# Patient Record
Sex: Male | Born: 1955
Health system: Southern US, Community
[De-identification: ages and names within clinical notes are randomized; demographics above are authoritative.]

## PROBLEM LIST (undated history)

## (undated) HISTORY — PX: BACK SURGERY: SHX140

## (undated) HISTORY — PX: KNEE ARTHROSCOPY: SUR90

---

## 2001-01-19 ENCOUNTER — Encounter: Payer: Self-pay | Admitting: Neurosurgery

## 2001-01-19 ENCOUNTER — Ambulatory Visit (HOSPITAL_COMMUNITY): Admission: RE | Admit: 2001-01-19 | Discharge: 2001-01-19 | Payer: Self-pay | Admitting: Neurosurgery

## 2001-01-23 ENCOUNTER — Encounter: Payer: Self-pay | Admitting: Neurosurgery

## 2001-01-23 ENCOUNTER — Encounter: Admission: RE | Admit: 2001-01-23 | Discharge: 2001-01-23 | Payer: Self-pay | Admitting: Neurosurgery

## 2001-02-06 ENCOUNTER — Encounter: Admission: RE | Admit: 2001-02-06 | Discharge: 2001-02-06 | Payer: Self-pay | Admitting: Neurosurgery

## 2001-02-06 ENCOUNTER — Encounter: Payer: Self-pay | Admitting: Neurosurgery

## 2001-02-08 ENCOUNTER — Inpatient Hospital Stay (HOSPITAL_COMMUNITY): Admission: RE | Admit: 2001-02-08 | Discharge: 2001-02-09 | Payer: Self-pay | Admitting: Neurosurgery

## 2001-02-08 ENCOUNTER — Encounter: Payer: Self-pay | Admitting: Neurosurgery

## 2001-03-27 ENCOUNTER — Ambulatory Visit (HOSPITAL_COMMUNITY): Admission: RE | Admit: 2001-03-27 | Discharge: 2001-03-27 | Payer: Self-pay | Admitting: Neurosurgery

## 2001-03-27 ENCOUNTER — Encounter: Payer: Self-pay | Admitting: Neurosurgery

## 2001-04-02 ENCOUNTER — Ambulatory Visit (HOSPITAL_COMMUNITY): Admission: RE | Admit: 2001-04-02 | Discharge: 2001-04-02 | Payer: Self-pay | Admitting: Neurosurgery

## 2001-04-02 ENCOUNTER — Encounter: Payer: Self-pay | Admitting: Neurosurgery

## 2003-03-10 ENCOUNTER — Ambulatory Visit (HOSPITAL_COMMUNITY): Admission: RE | Admit: 2003-03-10 | Discharge: 2003-03-10 | Payer: Self-pay | Admitting: Internal Medicine

## 2006-09-05 ENCOUNTER — Ambulatory Visit (HOSPITAL_COMMUNITY): Admission: RE | Admit: 2006-09-05 | Discharge: 2006-09-05 | Payer: Self-pay | Admitting: *Deleted

## 2011-12-22 ENCOUNTER — Observation Stay (HOSPITAL_COMMUNITY)
Admission: EM | Admit: 2011-12-22 | Discharge: 2011-12-23 | Disposition: A | Discharge status: Home / Self Care | Payer: 59 | Attending: Emergency Medicine | Admitting: Emergency Medicine

## 2011-12-22 ENCOUNTER — Encounter (HOSPITAL_COMMUNITY)
Admission: EM | Disposition: A | Discharge status: Home / Self Care | Payer: Self-pay | Source: Home / Self Care | Attending: Emergency Medicine

## 2011-12-22 ENCOUNTER — Other Ambulatory Visit: Payer: Self-pay | Admitting: Internal Medicine

## 2011-12-22 ENCOUNTER — Encounter (HOSPITAL_COMMUNITY): Payer: Self-pay | Admitting: *Deleted

## 2011-12-22 ENCOUNTER — Emergency Department (HOSPITAL_COMMUNITY): Payer: 59 | Admitting: *Deleted

## 2011-12-22 ENCOUNTER — Encounter (HOSPITAL_COMMUNITY): Payer: Self-pay | Admitting: Emergency Medicine

## 2011-12-22 ENCOUNTER — Ambulatory Visit
Admission: RE | Admit: 2011-12-22 | Discharge: 2011-12-22 | Disposition: A | Discharge status: Home / Self Care | Payer: 59 | Source: Ambulatory Visit | Attending: Internal Medicine | Admitting: Internal Medicine

## 2011-12-22 DIAGNOSIS — M129 Arthropathy, unspecified: Secondary | ICD-10-CM | POA: Insufficient documentation

## 2011-12-22 DIAGNOSIS — R1031 Right lower quadrant pain: Secondary | ICD-10-CM

## 2011-12-22 DIAGNOSIS — K3580 Unspecified acute appendicitis: Secondary | ICD-10-CM

## 2011-12-22 HISTORY — PX: LAPAROSCOPIC APPENDECTOMY: SHX408

## 2011-12-22 LAB — CBC WITH DIFFERENTIAL/PLATELET
Basophils Absolute: 0 10*3/uL (ref 0.0–0.1)
Eosinophils Relative: 1 % (ref 0–5)
HCT: 47.2 % (ref 39.0–52.0)
Hemoglobin: 16 g/dL (ref 13.0–17.0)
Lymphocytes Relative: 37 % (ref 12–46)
MCHC: 33.9 g/dL (ref 30.0–36.0)
MCV: 93.3 fL (ref 78.0–100.0)
Monocytes Absolute: 0.6 10*3/uL (ref 0.1–1.0)
Monocytes Relative: 5 % (ref 3–12)
Neutro Abs: 5.7 10*3/uL (ref 1.7–7.7)
RDW: 13.3 % (ref 11.5–15.5)
WBC: 10.1 10*3/uL (ref 4.0–10.5)

## 2011-12-22 LAB — COMPREHENSIVE METABOLIC PANEL
AST: 17 U/L (ref 0–37)
BUN: 14 mg/dL (ref 6–23)
CO2: 26 mEq/L (ref 19–32)
Calcium: 9.7 mg/dL (ref 8.4–10.5)
Chloride: 102 mEq/L (ref 96–112)
Creatinine, Ser: 1.18 mg/dL (ref 0.50–1.35)
GFR calc Af Amer: 79 mL/min — ABNORMAL LOW (ref >90)
GFR calc non Af Amer: 68 mL/min — ABNORMAL LOW (ref >90)
Total Bilirubin: 0.6 mg/dL (ref 0.3–1.2)

## 2011-12-22 SURGERY — APPENDECTOMY, LAPAROSCOPIC
Anesthesia: General | Site: Abdomen | Wound class: Clean Contaminated

## 2011-12-22 MED ORDER — SODIUM CHLORIDE 0.9 % IR SOLN
Status: DC | PRN
  Administered 2011-12-22: 1000 mL

## 2011-12-22 MED ORDER — ONDANSETRON HCL 4 MG PO TABS: 4.0000 mg | ORAL_TABLET | Freq: Four times a day (QID) | ORAL | Status: DC | PRN

## 2011-12-22 MED ORDER — ONDANSETRON HCL 4 MG/2ML IJ SOLN: 4.0000 mg | Freq: Four times a day (QID) | INTRAMUSCULAR | Status: DC | PRN

## 2011-12-22 MED ORDER — HYDROMORPHONE HCL PF 1 MG/ML IJ SOLN: 0.2500 mg | INTRAMUSCULAR | Status: DC | PRN

## 2011-12-22 MED ORDER — NEOSTIGMINE METHYLSULFATE 1 MG/ML IJ SOLN
INTRAMUSCULAR | Status: DC | PRN
  Administered 2011-12-22: 5 mg via INTRAVENOUS

## 2011-12-22 MED ORDER — ENOXAPARIN SODIUM 40 MG/0.4ML ~~LOC~~ SOLN
40.0000 mg | SUBCUTANEOUS | Status: DC
  Filled 2011-12-22: qty 0.4

## 2011-12-22 MED ORDER — DEXTROSE 5 % IV SOLN
2.0000 g | INTRAVENOUS | Status: AC
  Administered 2011-12-22: 2 g via INTRAVENOUS
  Filled 2011-12-22 (×2): qty 2

## 2011-12-22 MED ORDER — POTASSIUM CHLORIDE IN NACL 20-0.9 MEQ/L-% IV SOLN
INTRAVENOUS | Status: DC
  Administered 2011-12-22: 22:00:00 via INTRAVENOUS
  Filled 2011-12-22 (×2): qty 1000

## 2011-12-22 MED ORDER — PROPOFOL 10 MG/ML IV BOLUS
INTRAVENOUS | Status: DC | PRN
  Administered 2011-12-22: 170 mg via INTRAVENOUS

## 2011-12-22 MED ORDER — BUPIVACAINE-EPINEPHRINE (PF) 0.5% -1:200000 IJ SOLN
INTRAMUSCULAR | Status: AC
  Filled 2011-12-22: qty 10

## 2011-12-22 MED ORDER — DROPERIDOL 2.5 MG/ML IJ SOLN
INTRAMUSCULAR | Status: DC | PRN
  Administered 2011-12-22: 0.625 mg via INTRAVENOUS

## 2011-12-22 MED ORDER — LACTATED RINGERS IV SOLN
INTRAVENOUS | Status: DC | PRN
  Administered 2011-12-22: 18:00:00 via INTRAVENOUS

## 2011-12-22 MED ORDER — SUCCINYLCHOLINE CHLORIDE 20 MG/ML IJ SOLN
INTRAMUSCULAR | Status: DC | PRN
  Administered 2011-12-22: 120 mg via INTRAVENOUS

## 2011-12-22 MED ORDER — MORPHINE SULFATE 4 MG/ML IJ SOLN: 4.0000 mg | INTRAMUSCULAR | Status: DC | PRN

## 2011-12-22 MED ORDER — IOHEXOL 300 MG/ML  SOLN
125.0000 mL | Freq: Once | INTRAMUSCULAR | Status: AC | PRN
  Administered 2011-12-22: 125 mL via INTRAVENOUS

## 2011-12-22 MED ORDER — MIDAZOLAM HCL 5 MG/5ML IJ SOLN
INTRAMUSCULAR | Status: DC | PRN
  Administered 2011-12-22: 2 mg via INTRAVENOUS

## 2011-12-22 MED ORDER — FENTANYL CITRATE 0.05 MG/ML IJ SOLN
INTRAMUSCULAR | Status: DC | PRN
Start: 2011-12-22 — End: 2011-12-22
  Administered 2011-12-22: 100 ug via INTRAVENOUS
  Administered 2011-12-22: 50 ug via INTRAVENOUS

## 2011-12-22 MED ORDER — KETOROLAC TROMETHAMINE 30 MG/ML IJ SOLN
INTRAMUSCULAR | Status: DC | PRN
  Administered 2011-12-22: 30 mg via INTRAVENOUS

## 2011-12-22 MED ORDER — GLYCOPYRROLATE 0.2 MG/ML IJ SOLN
INTRAMUSCULAR | Status: DC | PRN
  Administered 2011-12-22: .6 mg via INTRAVENOUS

## 2011-12-22 MED ORDER — ONDANSETRON HCL 4 MG/2ML IJ SOLN
INTRAMUSCULAR | Status: DC | PRN
  Administered 2011-12-22: 4 mg via INTRAVENOUS

## 2011-12-22 MED ORDER — LIDOCAINE HCL (CARDIAC) 20 MG/ML IV SOLN
INTRAVENOUS | Status: DC | PRN
  Administered 2011-12-22: 80 mg via INTRAVENOUS

## 2011-12-22 MED ORDER — HYDROCODONE-ACETAMINOPHEN 5-325 MG PO TABS
1.0000 | ORAL_TABLET | ORAL | Status: DC | PRN
  Administered 2011-12-22: 1 via ORAL
  Filled 2011-12-22: qty 1

## 2011-12-22 MED ORDER — BUPIVACAINE-EPINEPHRINE 0.5% -1:200000 IJ SOLN
INTRAMUSCULAR | Status: DC | PRN
  Administered 2011-12-22: 20 mL

## 2011-12-22 MED ORDER — ROCURONIUM BROMIDE 100 MG/10ML IV SOLN
INTRAVENOUS | Status: DC | PRN
  Administered 2011-12-22: 30 mg via INTRAVENOUS

## 2011-12-22 MED ORDER — IBUPROFEN 600 MG PO TABS
600.0000 mg | ORAL_TABLET | Freq: Four times a day (QID) | ORAL | Status: DC | PRN
  Filled 2011-12-22: qty 1

## 2011-12-22 SURGICAL SUPPLY — 43 items
APL SKNCLS STERI-STRIP NONHPOA (GAUZE/BANDAGES/DRESSINGS) ×1
APPLIER CLIP LOGIC TI 5 (MISCELLANEOUS) IMPLANT
APPLIER CLIP ROT 10 11.4 M/L (STAPLE)
APR CLP MED LRG 11.4X10 (STAPLE)
APR CLP MED LRG 33X5 (MISCELLANEOUS)
BAG SPEC RTRVL LRG 6X4 10 (ENDOMECHANICALS) ×1
BANDAGE ADHESIVE 1X3 (GAUZE/BANDAGES/DRESSINGS) ×6 IMPLANT
BENZOIN TINCTURE PRP APPL 2/3 (GAUZE/BANDAGES/DRESSINGS) ×2 IMPLANT
BNDG ADH 5X3 H2O RPLNT NS (GAUZE/BANDAGES/DRESSINGS) ×1
BNDG COHESIVE 3X5 WHT NS (GAUZE/BANDAGES/DRESSINGS) ×1 IMPLANT
CANISTER SUCTION 2500CC (MISCELLANEOUS) ×2 IMPLANT
CHLORAPREP W/TINT 26ML (MISCELLANEOUS) ×2 IMPLANT
CLIP APPLIE ROT 10 11.4 M/L (STAPLE) IMPLANT
CLOTH BEACON ORANGE TIMEOUT ST (SAFETY) ×2 IMPLANT
COVER SURGICAL LIGHT HANDLE (MISCELLANEOUS) ×2 IMPLANT
CUTTER LINEAR ENDO 35 ETS (STAPLE) ×1 IMPLANT
CUTTER LINEAR ENDO 35 ETS TH (STAPLE) IMPLANT
DECANTER SPIKE VIAL GLASS SM (MISCELLANEOUS) ×2 IMPLANT
ELECT REM PT RETURN 9FT ADLT (ELECTROSURGICAL) ×2
ELECTRODE REM PT RTRN 9FT ADLT (ELECTROSURGICAL) ×1 IMPLANT
ENDOLOOP SUT PDS II  0 18 (SUTURE)
ENDOLOOP SUT PDS II 0 18 (SUTURE) IMPLANT
GLOVE SURG SIGNA 7.5 PF LTX (GLOVE) ×2 IMPLANT
GOWN PREVENTION PLUS XLARGE (GOWN DISPOSABLE) ×2 IMPLANT
GOWN STRL NON-REIN LRG LVL3 (GOWN DISPOSABLE) ×2 IMPLANT
KIT BASIN OR (CUSTOM PROCEDURE TRAY) ×2 IMPLANT
KIT ROOM TURNOVER OR (KITS) ×2 IMPLANT
NS IRRIG 1000ML POUR BTL (IV SOLUTION) ×2 IMPLANT
PAD ARMBOARD 7.5X6 YLW CONV (MISCELLANEOUS) ×4 IMPLANT
POUCH SPECIMEN RETRIEVAL 10MM (ENDOMECHANICALS) ×2 IMPLANT
RELOAD /EVU35 (ENDOMECHANICALS) IMPLANT
RELOAD CUTTER ETS 35MM STAND (ENDOMECHANICALS) IMPLANT
SCALPEL HARMONIC ACE (MISCELLANEOUS) ×2 IMPLANT
SET IRRIG TUBING LAPAROSCOPIC (IRRIGATION / IRRIGATOR) ×2 IMPLANT
SLEEVE ENDOPATH XCEL 5M (ENDOMECHANICALS) ×2 IMPLANT
SPECIMEN JAR SMALL (MISCELLANEOUS) ×2 IMPLANT
SUT MON AB 4-0 PC3 18 (SUTURE) ×2 IMPLANT
TOWEL OR 17X24 6PK STRL BLUE (TOWEL DISPOSABLE) ×2 IMPLANT
TOWEL OR 17X26 10 PK STRL BLUE (TOWEL DISPOSABLE) ×2 IMPLANT
TRAY LAPAROSCOPIC (CUSTOM PROCEDURE TRAY) ×2 IMPLANT
TROCAR XCEL BLUNT TIP 100MML (ENDOMECHANICALS) ×2 IMPLANT
TROCAR XCEL NON-BLD 5MMX100MML (ENDOMECHANICALS) ×2 IMPLANT
WATER STERILE IRR 1000ML POUR (IV SOLUTION) IMPLANT

## 2011-12-22 NOTE — Op Note (Signed)
[name], [name] NO.:  [id number]  MEDICAL RECORD NO.:  [id number]  LOCATION:  6N12C                        FACILITY:  [facility]  PHYSICIAN:  Abigail Miyamoto, M.D. DATE OF BIRTH:  Oct 11, 1955  DATE OF PROCEDURE:  12/22/2011 DATE OF DISCHARGE:                              OPERATIVE REPORT   PREOPERATIVE DIAGNOSIS:  Acute appendicitis.  POSTOPERATIVE DIAGNOSIS:  Acute appendicitis.  PROCEDURE:  Laparoscopic appendectomy.  SURGEON:  Abigail Miyamoto, MD  ANESTHESIA:  General and 0.5% Marcaine.  ESTIMATED BLOOD LOSS:  Minimal.  FINDINGS:  The patient was found have early acute appendicitis without evidence of perforation.  PROCEDURE IN DETAIL:  The patient was brought to the operating room, identified as Jimmy Munoz.  He was placed supine on the operating room table, and general anesthesia induced.  His abdomen was then prepped and draped in the usual sterile fashion.  Using a #15 blade, a small vertical incision was made below the umbilicus.  This was carried down to the fascia, which was opened with a scalpel.  A hemostat was then used to pass into the peritoneal cavity under direct vision.  Next, a 0 Vicryl pursestring suture was placed around the fascial opening.  The Hasson port was placed through the opening and insufflation of the abdomen was begun.  A 5-mm port was then placed in the patient's right upper quadrant and another in the lower midline under direct vision. The appendix was then identified and visualized.  It was found to be consistent with acute appendicitis without evidence of perforation.  I was able to easily dissect out the base of the appendix and transect it with the endoscopic GIA stapler.  I then took down the mesoappendix with the Harmonic Scalpel.  Once the appendix was free, it was placed in an endosac and removed through the incision at the umbilicus.  I then thoroughly irrigated the abdomen with normal saline.   Hemostasis appeared to be achieved at the appendiceal stump and mesoappendix.  All ports were removed under direct vision.  The abdomen was deflated.  The 0 Vicryl at the umbilicus was tied in place closing the fascial defect. All incisions were anesthetized with Marcaine and closed with 4-0 Monocryl subcuticular sutures.  Steri- Strips and Band-Aids were then applied.  The patient tolerated the procedure well.  All the counts were correct at the end of the procedure.  The patient was then extubated in the operating room and taken in a stable condition to the recovery room.     Abigail Miyamoto, M.D.     DB/MEDQ  D:  12/22/2011  T:  12/22/2011  Job:  [id number]

## 2011-12-22 NOTE — H&P (Signed)
Jimmy Munoz is an 56 y.o. male.   Chief Complaint: RLQ abdominal pain HPI: this gentleman presents with a two-day history of right lower quadrant abdominal pain.  He saw his primary care physician yesterday and was started on antibiotics. A CAT scan of the abdomen today demonstrates appendicitis. He reports the pain is only mild to moderate nature. It refers across his lower abdomen. He has no nausea or vomiting. He denies fevers. He is otherwise without complaints. History reviewed. No pertinent past medical history.  Past Surgical History  Procedure Date  . Back surgery   . Knee arthroscopy     No family history on file. Social History:  reports that he has never smoked. He does not have any smokeless tobacco history on file. He reports that he drinks alcohol. He reports that he does not use illicit drugs.  Allergies: No Known Allergies   (Not in a hospital admission)  Results for orders placed during the hospital encounter of 12/22/11 (from the past 48 hour(s))  CBC WITH DIFFERENTIAL     Status: Normal   Collection Time   12/22/11  4:20 PM      Component Value Range Comment   WBC 10.1  4.0 - 10.5 K/uL    RBC 5.06  4.22 - 5.81 MIL/uL    Hemoglobin 16.0  13.0 - 17.0 g/dL    HCT 16.1  09.6 - 04.5 %    MCV 93.3  78.0 - 100.0 fL    MCH 31.6  26.0 - 34.0 pg    MCHC 33.9  30.0 - 36.0 g/dL    RDW 40.9  81.1 - 91.4 %    Platelets 222  150 - 400 K/uL    Neutrophils Relative 57  43 - 77 %    Neutro Abs 5.7  1.7 - 7.7 K/uL    Lymphocytes Relative 37  12 - 46 %    Lymphs Abs 3.8  0.7 - 4.0 K/uL    Monocytes Relative 5  3 - 12 %    Monocytes Absolute 0.6  0.1 - 1.0 K/uL    Eosinophils Relative 1  0 - 5 %    Eosinophils Absolute 0.1  0.0 - 0.7 K/uL    Basophils Relative 0  0 - 1 %    Basophils Absolute 0.0  0.0 - 0.1 K/uL   COMPREHENSIVE METABOLIC PANEL     Status: Abnormal   Collection Time   12/22/11  4:20 PM      Component Value Range Comment   Sodium 138  135 - 145 mEq/L    Potassium 3.7  3.5 - 5.1 mEq/L    Chloride 102  96 - 112 mEq/L    CO2 26  19 - 32 mEq/L    Glucose, Bld 94  70 - 99 mg/dL    BUN 14  6 - 23 mg/dL    Creatinine, Ser 7.82  0.50 - 1.35 mg/dL    Calcium 9.7  8.4 - 95.6 mg/dL    Total Protein 7.3  6.0 - 8.3 g/dL    Albumin 4.1  3.5 - 5.2 g/dL    AST 17  0 - 37 U/L    ALT 16  0 - 53 U/L    Alkaline Phosphatase 69  39 - 117 U/L    Total Bilirubin 0.6  0.3 - 1.2 mg/dL    GFR calc non Af Amer 68 (*) >90 mL/min    GFR calc Af Amer 79 (*) >90 mL/min  Ct Abdomen Pelvis W Contrast  12/22/2011  *RADIOLOGY REPORT*  Clinical Data: Right lower quadrant pain.  CT ABDOMEN AND PELVIS WITH CONTRAST  Technique:  Multidetector CT imaging of the abdomen and pelvis was performed following the standard protocol during bolus administration of intravenous contrast.  Contrast: OMNIPAQUE IOHEXOL 300 MG/ML  SOLN  Comparison: None.  Findings: There is acute appendicitis with periappendiceal inflammation but without abscess or perforation.  The liver, spleen, pancreas, biliary tree, adrenal glands, and kidneys are normal except for a 3.2 cm simple appearing cyst on the upper pole of the left kidney.  The bowel is normal except for appendicitis.  Terminal ileum is normal.  No adenopathy.  No significant osseous abnormality.  IMPRESSION: Acute appendicitis.  No perforation or abscess.  Original Report Authenticated By: Gwynn Burly, M.D.    Review of Systems  Constitutional: Negative for fever and chills.  HENT: Negative.   Eyes: Negative.   Respiratory: Negative.   Cardiovascular: Negative.   Gastrointestinal: Positive for abdominal pain. Negative for nausea and vomiting.  Genitourinary: Negative.   Skin: Negative.   Neurological: Negative.   Endo/Heme/Allergies: Negative.   Psychiatric/Behavioral: Negative.     Blood pressure 115/74, pulse 81, temperature 97.5 F (36.4 C), temperature source Oral, SpO2 96.00%. Physical Exam  Constitutional: He is  oriented to person, place, and time. He appears well-developed and well-nourished. No distress.  HENT:  Head: Normocephalic and atraumatic.  Right Ear: External ear normal.  Left Ear: External ear normal.  Nose: Nose normal.  Mouth/Throat: Oropharynx is clear and moist. No oropharyngeal exudate.  Eyes: Conjunctivae are normal. Pupils are equal, round, and reactive to light. Right eye exhibits no discharge. Left eye exhibits no discharge. No scleral icterus.  Neck: Normal range of motion. Neck supple. No tracheal deviation present. No thyromegaly present.  Cardiovascular: Normal rate, regular rhythm, normal heart sounds and intact distal pulses.   No murmur heard. Respiratory: Effort normal and breath sounds normal. No respiratory distress. He has no wheezes.  GI: Soft. Bowel sounds are normal. There is tenderness. There is guarding.       There is tenderness with mild guarding in the right lower quadrant. The rest of the abdomen is soft and nontender  Musculoskeletal: Normal range of motion. He exhibits no edema and no tenderness.  Lymphadenopathy:    He has no cervical adenopathy.  Neurological: He is alert and oriented to person, place, and time.  Skin: Skin is warm and dry. No rash noted. He is not diaphoretic. No erythema.  Psychiatric: His behavior is normal. Judgment normal.     Assessment/Plan Acute appendicitis  Lap appendectomy is recommended. I discussed this with the patient and his wife in detail. I discussed the risks of surgery which includes but is not limited to bleeding, infection, injury to stranding structures, need to convert to an open procedure, appendiceal stump leak, etc. He understands and wishes to proceed. Surgery will be scheduled. Preop antibiotics will be given. Likelihood of success is good  Sophiana Milanese A 12/22/2011, 5:22 PM

## 2011-12-22 NOTE — ED Notes (Signed)
Dr. Magnus Ivan in with patient to discuss surgery

## 2011-12-22 NOTE — Transfer of Care (Signed)
Immediate Anesthesia Transfer of Care Note  Patient: Jimmy Munoz  Procedure(s) Performed: Procedure(s) (LRB): APPENDECTOMY LAPAROSCOPIC (N/A)  Patient Location: PACU  Anesthesia Type: General  Level of Consciousness: awake, alert  and oriented  Airway & Oxygen Therapy: Patient Spontanous Breathing and Patient connected to face mask oxygen  Post-op Assessment: Report given to PACU RN and Post -op Vital signs reviewed and stable  Post vital signs: Reviewed and stable  Complications: No apparent anesthesia complications

## 2011-12-22 NOTE — Anesthesia Preprocedure Evaluation (Addendum)
Anesthesia Evaluation  Patient identified by MRN, date of birth, ID band Patient awake    Reviewed: Allergy & Precautions, H&P , NPO status , Patient's Chart, lab work & pertinent test results, reviewed documented beta blocker date and time   Airway Mallampati: II  Neck ROM: full    Dental  (+) Teeth Intact and Dental Advisory Given   Pulmonary          Cardiovascular     Neuro/Psych    GI/Hepatic   Endo/Other    Renal/GU      Musculoskeletal  (+) Arthritis -,   Abdominal   Peds  Hematology   Anesthesia Other Findings   Reproductive/Obstetrics                          Anesthesia Physical Anesthesia Plan  ASA: II and Emergent  Anesthesia Plan: General   Post-op Pain Management:    Induction: Intravenous  Airway Management Planned: Oral ETT  Additional Equipment:   Intra-op Plan:   Post-operative Plan: Extubation in OR  Informed Consent: I have reviewed the patients History and Physical, chart, labs and discussed the procedure including the risks, benefits and alternatives for the proposed anesthesia with the patient or authorized representative who has indicated his/her understanding and acceptance.   Dental advisory given  Plan Discussed with: CRNA, Surgeon and Anesthesiologist  Anesthesia Plan Comments:        Anesthesia Quick Evaluation

## 2011-12-22 NOTE — ED Provider Notes (Signed)
History     CSN: [id number]  Arrival date & time 12/22/11  1551   First MD Initiated Contact with Patient 12/22/11 1622      Chief Complaint  Patient presents with  . Abdominal Pain    (Consider location/radiation/quality/duration/timing/severity/associated sxs/prior treatment) Patient is a 56 y.o. male presenting with abdominal pain. The history is provided by the patient and medical records.  Abdominal Pain The primary symptoms of the illness include abdominal pain, nausea and vomiting. The primary symptoms of the illness do not include fever, shortness of breath, diarrhea or dysuria. The current episode started 2 days ago. The onset of the illness was gradual. The problem has not changed since onset. The abdominal pain is located in the suprapubic region and RLQ. The abdominal pain does not radiate. Pain scale: moderate. The abdominal pain is relieved by nothing.  The patient has not had a change in bowel habit. Additional symptoms associated with the illness include chills and anorexia. Symptoms associated with the illness do not include urgency, frequency or back pain.    History reviewed. No pertinent past medical history.  Past Surgical History  Procedure Date  . Back surgery   . Knee arthroscopy     No family history on file.  History  Substance Use Topics  . Smoking status: Never Smoker   . Smokeless tobacco: Not on file  . Alcohol Use: Yes     ocassional      Review of Systems  Unable to perform ROS Constitutional: Positive for chills. Negative for fever.  Respiratory: Negative for cough and shortness of breath.   Cardiovascular: Negative for chest pain and palpitations.  Gastrointestinal: Positive for nausea, vomiting, abdominal pain and anorexia. Negative for diarrhea.  Genitourinary: Negative for dysuria, urgency and frequency.  Musculoskeletal: Negative for back pain.  Skin: Negative for color change and rash.  Neurological: Negative for  light-headedness and headaches.  All other systems reviewed and are negative.    Allergies  Review of patient's allergies indicates no known allergies.  Home Medications   Current Outpatient Rx  Name Route Sig Dispense Refill  . ACETAMINOPHEN 500 MG PO TABS Oral Take 1,000 mg by mouth every 6 (six) hours as needed. For pain    . CIPROFLOXACIN HCL 500 MG PO TABS Oral Take 500 mg by mouth 2 (two) times daily.    Marland Kitchen METRONIDAZOLE 500 MG PO TABS Oral Take 500 mg by mouth 3 (three) times daily.    Marland Kitchen PROMETHAZINE HCL 25 MG PO TABS Oral Take 25 mg by mouth 3 (three) times daily as needed. For nausea      BP 115/74  Pulse 81  Temp 97.5 F (36.4 C) (Oral)  SpO2 96%  Physical Exam  Nursing note and vitals reviewed. Constitutional: He is oriented to person, place, and time. He appears well-developed and well-nourished.  HENT:  Head: Normocephalic and atraumatic.  Eyes: EOM are normal. Pupils are equal, round, and reactive to light.  Cardiovascular: Normal rate and regular rhythm.   Pulmonary/Chest: Effort normal and breath sounds normal. No respiratory distress.  Abdominal: Soft. Bowel sounds are normal. He exhibits no distension. There is tenderness (mild, RLQ). There is no rebound and no guarding.  Neurological: He is alert and oriented to person, place, and time.  Skin: Skin is warm and dry.  Psychiatric: He has a normal mood and affect.    ED Course  Procedures (including critical care time)  Labs Reviewed  COMPREHENSIVE METABOLIC PANEL - Abnormal; Notable for  the following:    GFR calc non Af Amer 68 (*)     GFR calc Af Amer 79 (*)     All other components within normal limits  CBC WITH DIFFERENTIAL   Ct Abdomen Pelvis W Contrast  12/22/2011  *RADIOLOGY REPORT*  Clinical Data: Right lower quadrant pain.  CT ABDOMEN AND PELVIS WITH CONTRAST  Technique:  Multidetector CT imaging of the abdomen and pelvis was performed following the standard protocol during bolus administration  of intravenous contrast.  Contrast: OMNIPAQUE IOHEXOL 300 MG/ML  SOLN  Comparison: None.  Findings: There is acute appendicitis with periappendiceal inflammation but without abscess or perforation.  The liver, spleen, pancreas, biliary tree, adrenal glands, and kidneys are normal except for a 3.2 cm simple appearing cyst on the upper pole of the left kidney.  The bowel is normal except for appendicitis.  Terminal ileum is normal.  No adenopathy.  No significant osseous abnormality.  IMPRESSION: Acute appendicitis.  No perforation or abscess.  Original Report Authenticated By: Gwynn Burly, M.D.     1. Acute appendicitis       MDM  56 year old male presents her to be acute appendicitis. He states that in the evening of the 24th he began having suprapubic pain, and when he woke up on the morning of the 25th had significantly worsened and had migrated towards the right upper quadrant. He states that he had nausea with several episodes of vomiting and was seen by his PCP that afternoon. Blood work was drawn, which showed a reported leukocytosis, so patient was scheduled for a CT scan this afternoon. The patient received a call from his doctor shortly after he got home stating that he had acute appendicitis, and was advised to come to the ED. The patient states that his pain is overall better than it was yesterday, he still has some nausea, but has not vomited since yesterday. He has no prior surgeries, and last ate at about 2:30 this afternoon; patient was advised not to eat anything further. Patient with mild tenderness on exam, and no peritoneal signs. Surgery was consult, and will take him to the OR.        Theotis Burrow, MD 12/23/11 [phone]

## 2011-12-22 NOTE — Op Note (Signed)
APPENDECTOMY LAPAROSCOPIC  Procedure Note  Jimmy Munoz 12/22/2011   Pre-op Diagnosis: acute appendicitis     Post-op Diagnosis: same  Procedure(s): APPENDECTOMY LAPAROSCOPIC  Surgeon(s): Shelly Rubenstein, MD  Anesthesia: General  Staff:  Verdie Drown, RN - Circulator Staci Righter, CST - Scrub Person Harvie Bridge, RN - Circulator Allyson Dennison Nancy, RN - Circulator Donald Pore, CST - Scrub Person  Estimated Blood Loss: Minimal               Specimens: appendix          Jimmy Munoz   Date: 12/22/2011  Time: 7:05 PM

## 2011-12-22 NOTE — Preoperative (Signed)
Beta Blockers   Reason not to administer Beta Blockers:Not Applicable 

## 2011-12-22 NOTE — Anesthesia Postprocedure Evaluation (Signed)
Anesthesia Post Note  Patient: Jimmy Munoz  Procedure(s) Performed: Procedure(s) (LRB): APPENDECTOMY LAPAROSCOPIC (N/A)  Anesthesia type: General  Patient location: PACU  Post pain: Pain level controlled and Adequate analgesia  Post assessment: Post-op Vital signs reviewed, Patient's Cardiovascular Status Stable, Respiratory Function Stable, Patent Airway and Pain level controlled  Last Vitals:  Filed Vitals:   12/22/11 1920  BP: 153/88  Pulse: 75  Temp: 36.9 C  Resp: 18    Post vital signs: Reviewed and stable  Level of consciousness: awake, alert  and oriented  Complications: No apparent anesthesia complications

## 2011-12-22 NOTE — ED Notes (Signed)
Dr. Mahoney at bedside.

## 2011-12-22 NOTE — ED Notes (Signed)
Pt st's he has had lower abd pain since Wed.  Saw his md today and had a CT scan showing inflamed appendix

## 2011-12-23 MED ORDER — HYDROCODONE-ACETAMINOPHEN 5-325 MG PO TABS: 1.0000 | ORAL_TABLET | ORAL | Status: DC | PRN

## 2011-12-23 NOTE — Discharge Summary (Signed)
Physician Discharge Summary  Patient ID: Jimmy Munoz MRN: [id number] DOB/AGE: [birth year] 56 y.o.  Admit date: 12/22/2011 Discharge date: 12/23/2011  Admission Diagnoses:  ACUTE APPENDICITIS  Discharge Diagnoses: same Active Problems:  * No active hospital problems. *    Discharged Condition: good  Hospital Course: admitted post op to floor.  Uneventful post op recovery.  Discharged home POD#1  Consults: None  Significant Diagnostic Studies:   Treatments: surgery: lap appendectomy  Discharge Exam: Blood pressure 124/76, pulse 61, temperature 98.5 F (36.9 C), temperature source Oral, resp. rate 18, height 6' (1.829 m), weight 214 lb 1.1 oz (97.1 kg), SpO2 98.00%. General appearance: alert and no distress Resp: clear to auscultation bilaterally Cardio: regular rate and rhythm, S1, S2 normal, no murmur, click, rub or gallop Incision/Wound: abdomen soft, incisions clean  Disposition:   Discharge Orders    Future Orders Please Complete By Expires   Discharge patient        Medication List  As of 12/23/2011  6:44 AM   TAKE these medications         acetaminophen 500 MG tablet   Commonly known as: TYLENOL   Take 1,000 mg by mouth every 6 (six) hours as needed. For pain      ciprofloxacin 500 MG tablet   Commonly known as: CIPRO   Take 500 mg by mouth 2 (two) times daily.      HYDROcodone-acetaminophen 5-325 MG per tablet   Commonly known as: NORCO/VICODIN   Take 1-2 tablets by mouth every 4 (four) hours as needed.      metroNIDAZOLE 500 MG tablet   Commonly known as: FLAGYL   Take 500 mg by mouth 3 (three) times daily.      promethazine 25 MG tablet   Commonly known as: PHENERGAN   Take 25 mg by mouth 3 (three) times daily as needed. For nausea           Follow-up Information    Follow up with [facility] A, MD. Call in 1 week. [phone])    Contact information:   [facility] [address], [address] [place]  [address] [phone]          Signed: Shelly Rubenstein 12/23/2011, 6:44 AM

## 2011-12-23 NOTE — Progress Notes (Signed)
Patient ID: Jimmy Munoz, male   DOB: [birth year], 56 y.o.   MRN: [id number] POD#1  Comfortable Abdomen soft, appropriately tender, dressings dry  Plan:  Discharge home

## 2011-12-23 NOTE — ED Provider Notes (Signed)
Medical screening examination/treatment/procedure(s) were conducted as a shared visit with non-physician practitioner(s) and myself.  I personally evaluated the patient during the encounter  Toy Baker, MD 12/23/11 1627

## 2011-12-25 ENCOUNTER — Encounter (HOSPITAL_COMMUNITY): Payer: Self-pay | Admitting: Surgery

## 2012-01-02 ENCOUNTER — Encounter (INDEPENDENT_AMBULATORY_CARE_PROVIDER_SITE_OTHER): Payer: Self-pay | Admitting: Surgery

## 2012-01-02 ENCOUNTER — Ambulatory Visit (INDEPENDENT_AMBULATORY_CARE_PROVIDER_SITE_OTHER): Payer: 59 | Admitting: Surgery

## 2012-01-02 VITALS — BP 144/86 | HR 72 | Temp 97.3°F | Resp 20 | Ht 73.0 in | Wt 207.0 lb

## 2012-01-02 DIAGNOSIS — Z9049 Acquired absence of other specified parts of digestive tract: Secondary | ICD-10-CM | POA: Insufficient documentation

## 2012-01-02 DIAGNOSIS — Z9889 Other specified postprocedural states: Secondary | ICD-10-CM

## 2012-01-02 DIAGNOSIS — Z09 Encounter for follow-up examination after completed treatment for conditions other than malignant neoplasm: Secondary | ICD-10-CM

## 2012-01-02 NOTE — Progress Notes (Signed)
Subjective:     Patient ID: Jimmy Munoz, male   DOB: [birth year], 56 y.o.   MRN: [id number]  HPI He is here for his first postoperative visit status post laparoscopic appendectomy. He is doing well and has no complaints.  Review of Systems     Objective:   Physical Exam His incisions are healing well. Her final pathology showed appendicitis with no other abnormalities    Assessment:     Patient doing well status post left upper appendectomy    Plan:     He will refrain from heavy lifting for one more week. We will see him back as needed

## 2014-04-07 IMAGING — CT CT ABD-PELV W/ CM
3 of 5 series · 13 of 36 positions shown, 19 images · IV contrast (READICAT/WATER & [ID] OMNI [id number])
Comparison: None.

CLINICAL DATA: Right lower quadrant pain.

CT ABDOMEN AND PELVIS WITH CONTRAST
TECHNIQUE: Multidetector CT imaging of the abdomen and pelvis was
performed following the standard protocol during bolus
administration of intravenous contrast.
Contrast: 125mL OMNIPAQUE IOHEXOL 300 MG/ML  SOLN

[Series 3: abd/pelvis with · axial · 0.78mm/px · z∈[-354,-14]mm · 7 of 92 slices shown, 12 images]
[im 12/92  soft-tissue]
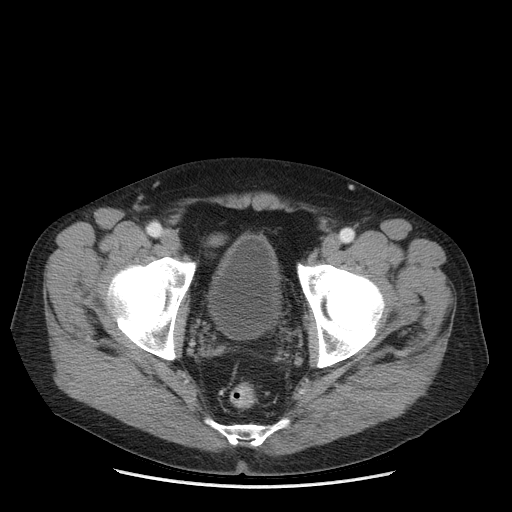
[im 12/92  bone]
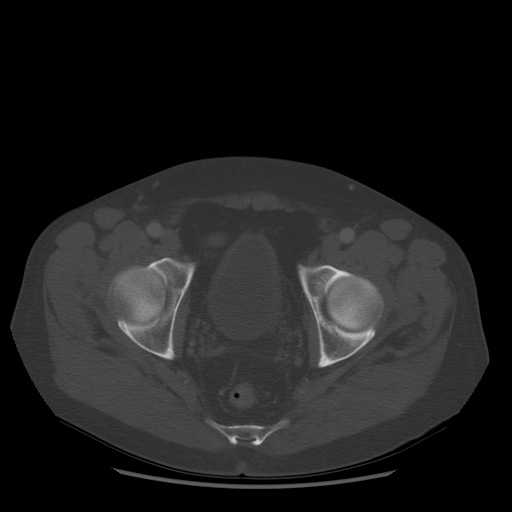
[im 23/92  soft-tissue]
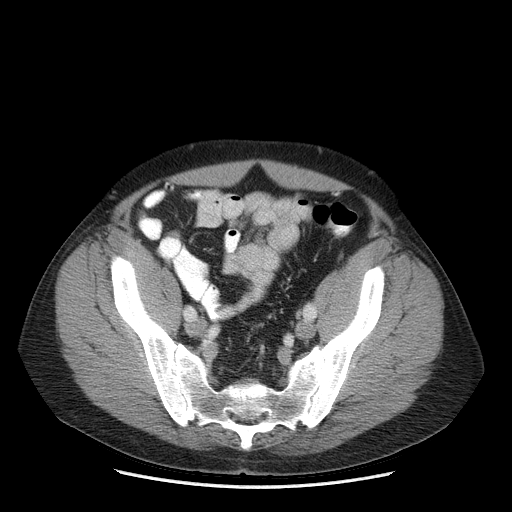
[im 35/92  soft-tissue]
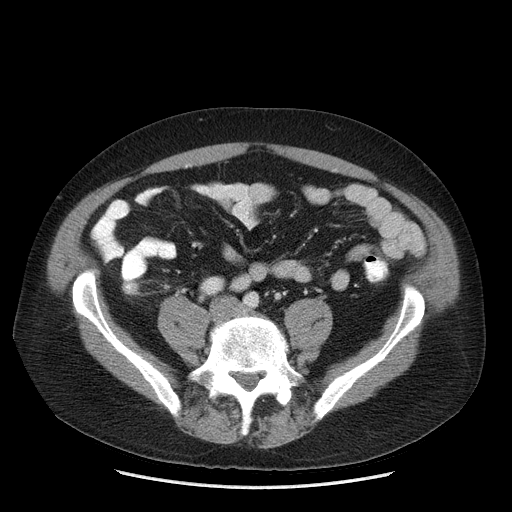
[im 46/92  soft-tissue]
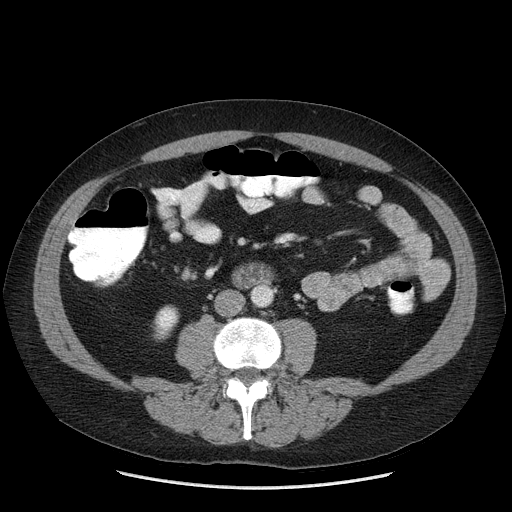
[im 46/92  lung]
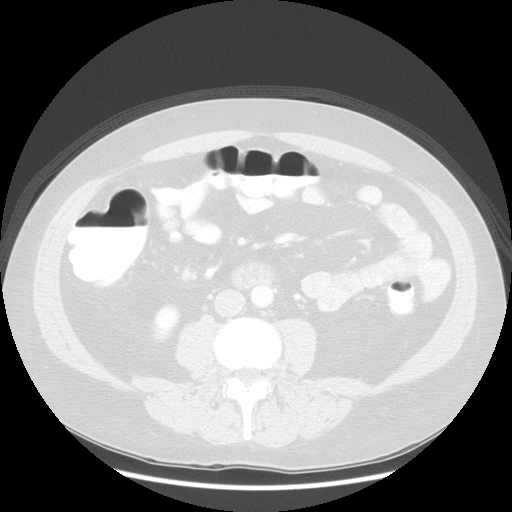
[im 57/92  soft-tissue]
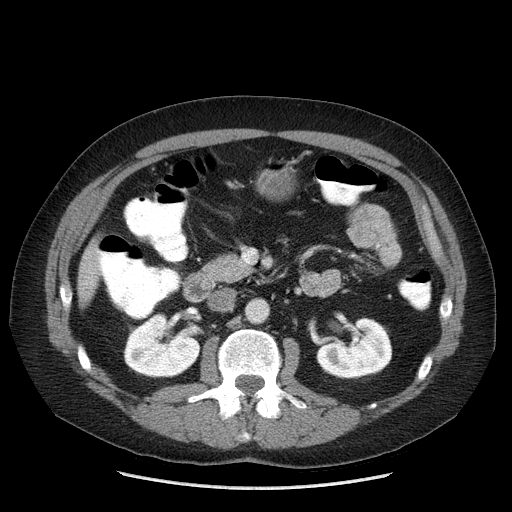
[im 57/92  lung]
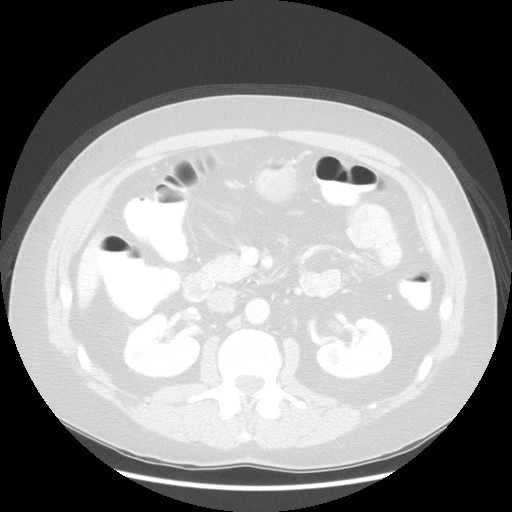
[im 69/92  soft-tissue]
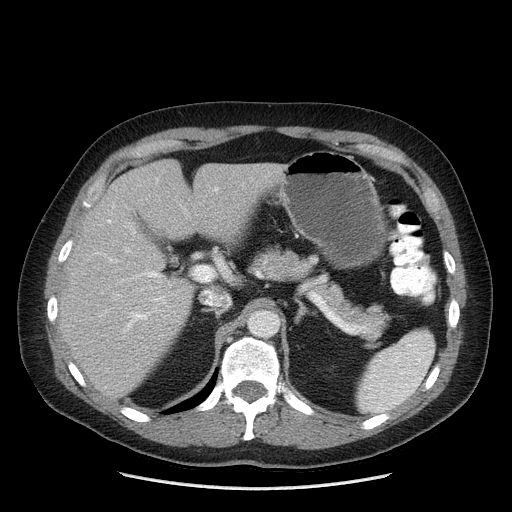
[im 69/92  lung]
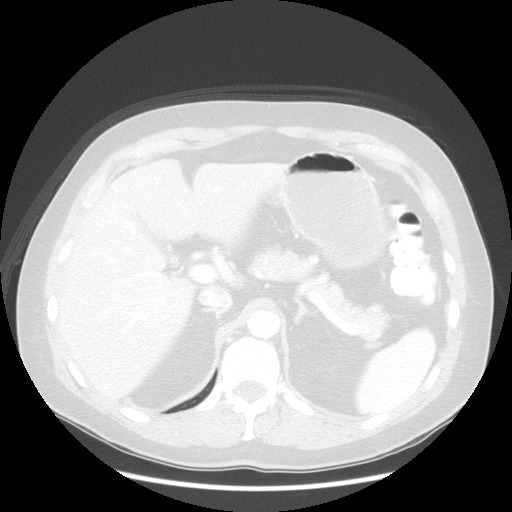
[im 80/92  soft-tissue]
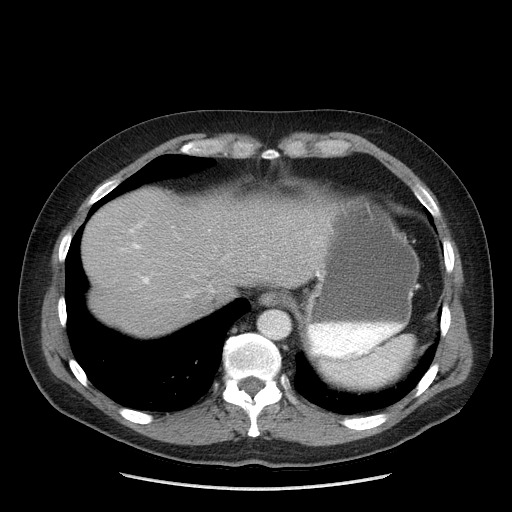
[im 80/92  lung]
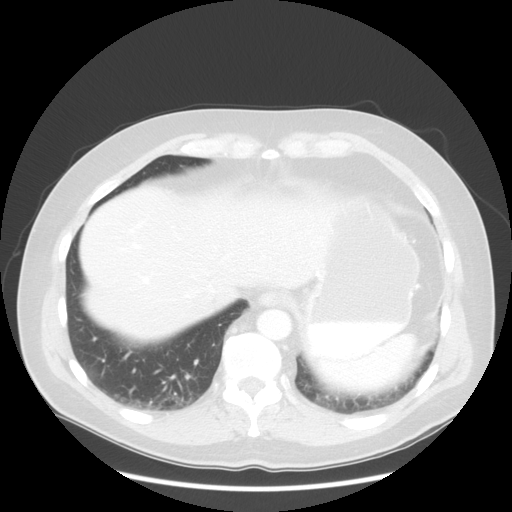

[Series 601: coronal body · coronal · 0.89mm/px · 1 of 122 slices shown, 2 images]
[im 41/122  soft-tissue]
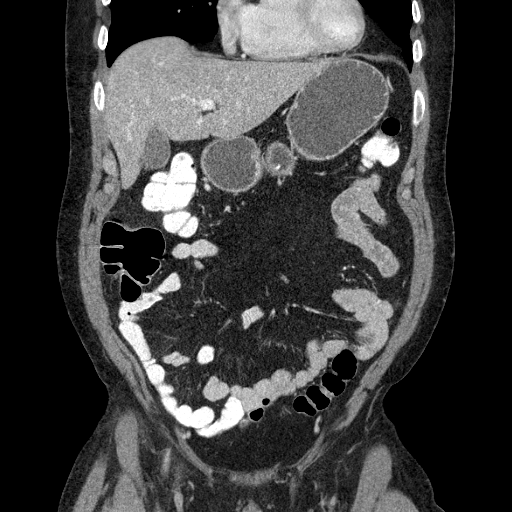
[im 41/122  bone]
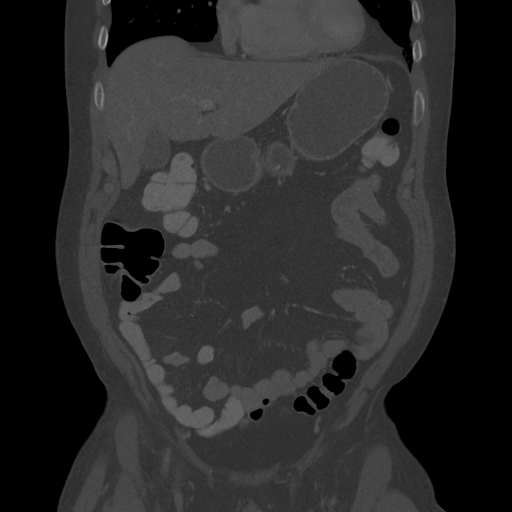

[Series 602: sagittal body · sagittal · 0.89mm/px · 5 of 160 slices shown]
[im 10/160  soft-tissue]
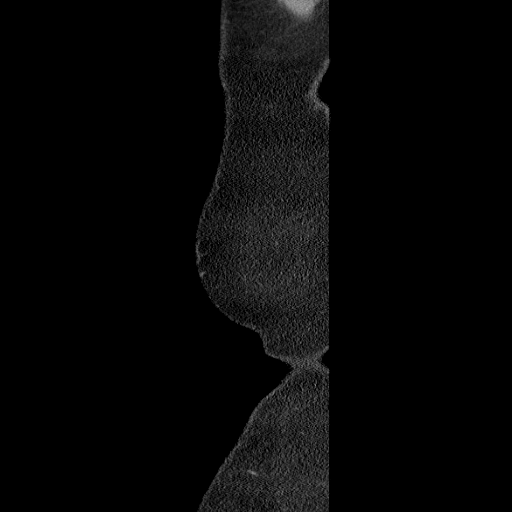
[im 30/160  soft-tissue]
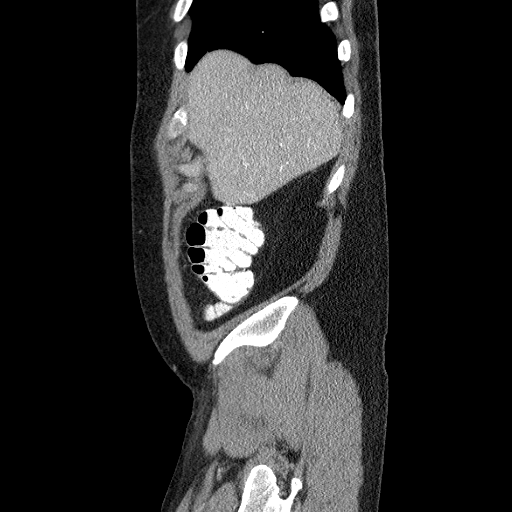
[im 50/160  soft-tissue]
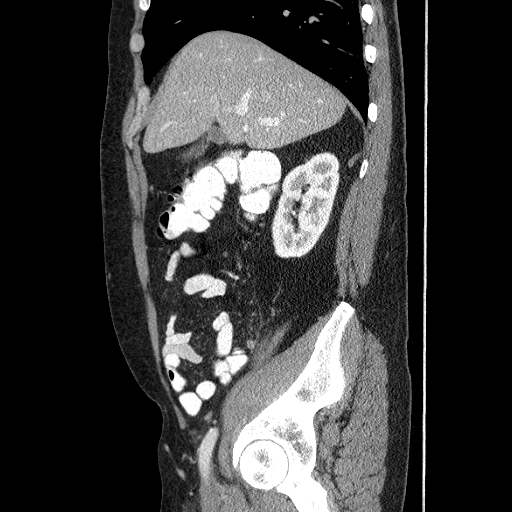
[im 70/160  soft-tissue]
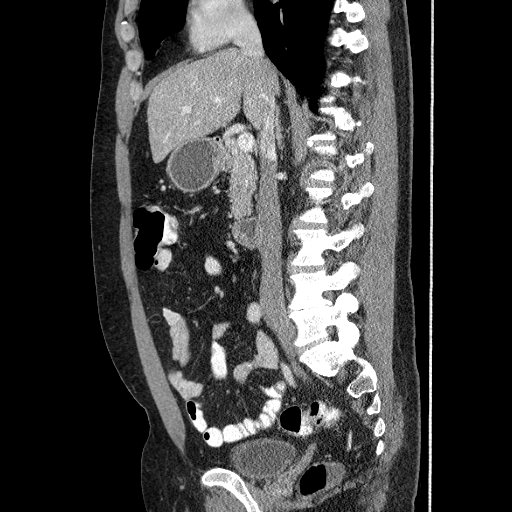
[im 90/160  soft-tissue]
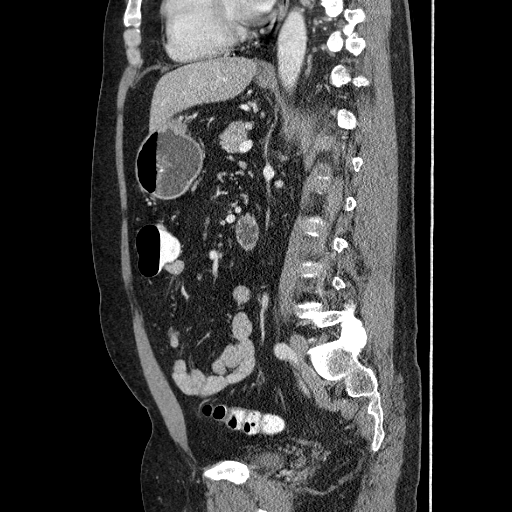

[13 of 36 positions shown; findings below may reference images not displayed]

FINDINGS: There is acute appendicitis with periappendiceal
inflammation but without abscess or perforation.

The liver, spleen, pancreas, biliary tree, adrenal glands, and
kidneys are normal except for a 3.2 cm simple appearing cyst on the
upper pole of the left kidney.

The bowel is normal except for appendicitis.  Terminal ileum is
normal.

No adenopathy.  No significant osseous abnormality.
IMPRESSION: Acute appendicitis.  No perforation or abscess.

## 2015-08-03 ENCOUNTER — Telehealth: Payer: Self-pay | Admitting: Podiatry

## 2015-08-03 NOTE — Telephone Encounter (Signed)
Pt. Called upset that he had to leave a message. He was a previous pt. Of Dr. Stacie AcresMayer office and not aware that he was in with the [facility]. I explained to the pt. That sometimes I may be on the phone with another pt. But I would get back in touch with him. Pt. Calmed down and then got upset after I asked for his insurance information. Decided not to be seen at our office, he said this is too much trouble.
